# Patient Record
Sex: Female | Born: 1985 | Race: White
Health system: Northeastern US, Academic
[De-identification: ages and names within clinical notes are randomized; demographics above are authoritative.]

## PROBLEM LIST (undated history)

## (undated) DIAGNOSIS — Z8742 Personal history of other diseases of the female genital tract: Secondary | ICD-10-CM

## (undated) DIAGNOSIS — K589 Irritable bowel syndrome without diarrhea: Secondary | ICD-10-CM

## (undated) DIAGNOSIS — F419 Anxiety disorder, unspecified: Secondary | ICD-10-CM

## (undated) HISTORY — PX: OTHER SURGICAL HISTORY: SHX169

## (undated) HISTORY — DX: Personal history of other diseases of the female genital tract: Z87.42

## (undated) HISTORY — DX: Anxiety disorder, unspecified: F41.9

## (undated) HISTORY — PX: CHOLECYSTECTOMY: SHX55

## (undated) HISTORY — DX: Irritable bowel syndrome, unspecified: K58.9

---

## 2017-09-28 LAB — HM MAMMOGRAPHY

## 2019-03-25 ENCOUNTER — Ambulatory Visit
Admission: AD | Admit: 2019-03-25 | Discharge: 2019-03-25 | Disposition: A | Discharge status: Home / Self Care | Payer: PRIVATE HEALTH INSURANCE | Source: Ambulatory Visit | Attending: Emergency Medicine | Admitting: Emergency Medicine

## 2019-03-25 ENCOUNTER — Other Ambulatory Visit
Admission: RE | Admit: 2019-03-25 | Discharge: 2019-03-25 | Disposition: A | Discharge status: Home / Self Care | Payer: PRIVATE HEALTH INSURANCE | Source: Ambulatory Visit | Attending: Ophthalmology | Admitting: Ophthalmology

## 2019-03-25 DIAGNOSIS — Z20828 Contact with and (suspected) exposure to other viral communicable diseases: Secondary | ICD-10-CM | POA: Insufficient documentation

## 2019-03-25 DIAGNOSIS — Z1159 Encounter for screening for other viral diseases: Secondary | ICD-10-CM | POA: Insufficient documentation

## 2019-03-25 NOTE — ED Triage Notes (Signed)
Patient presenting to Urgent Care for testing only. COVID-19 test ordered by outside provider     Does the patient currently have symptoms concerning for COVID-19?: No     What is the reason for testing?: Pre-procedural     NP swab obtained and sent for analysis.         Triage Note   Taylormarie Register, RN

## 2019-03-26 LAB — COVID-19 NAAT (PCR): COVID-19 NAAT (PCR): 0

## 2019-10-24 LAB — BASIC METABOLIC PANEL
BUN: 15 (ref 4–21)
CO2: 22 (ref 13–22)
Chloride: 105 (ref 99–108)
Creatinine: 0.8 (ref 0.5–1.1)
Glucose: 89
Potassium: 3.9 (ref 3.4–5.3)
Sodium: 138 (ref 137–147)

## 2019-10-24 LAB — CBC AND DIFFERENTIAL
HCT: 39 (ref 36–46)
Hemoglobin: 13.2 (ref 12.0–16.0)
Platelets: 288 (ref 150–399)
WBC: 6.9

## 2019-10-24 LAB — TSH: TSH: 1.2 (ref 0.41–5.90)

## 2019-10-24 LAB — COMPREHENSIVE METABOLIC PANEL
Albumin: 4.6 (ref 3.5–5.0)
Calcium: 9.6 (ref 8.7–10.7)
Globulin: 2.5

## 2019-10-24 LAB — HEPATIC FUNCTION PANEL
ALT: 14 (ref 7–35)
AST: 18 (ref 13–35)
Alkaline Phosphatase: 26 (ref 25–125)
Bilirubin, Total: 0.3

## 2020-04-01 LAB — BASIC METABOLIC PANEL
BUN: 15 (ref 4–21)
CO2: 22 (ref 13–22)
Chloride: 106 (ref 99–108)
Creatinine: 0.8 (ref 0.5–1.1)
Glucose: 91
Potassium: 3.6 (ref 3.4–5.3)
Sodium: 139 (ref 137–147)

## 2020-04-01 LAB — CBC AND DIFFERENTIAL
HCT: 41 (ref 36–46)
Hemoglobin: 13.3 (ref 12.0–16.0)
Neutrophils Absolute: 2876
Platelets: 224 (ref 150–399)
WBC: 4.9

## 2020-04-01 LAB — TSH: TSH: 1.14 (ref 0.41–5.90)

## 2020-04-01 LAB — HEPATIC FUNCTION PANEL
ALT: 12 (ref 7–35)
AST: 14 (ref 13–35)
Alkaline Phosphatase: 19 — AB (ref 25–125)
Bilirubin, Total: 0.7

## 2020-04-01 LAB — COMPREHENSIVE METABOLIC PANEL
Albumin: 4.3 (ref 3.5–5.0)
Calcium: 9.1 (ref 8.7–10.7)
Globulin: 2.7

## 2021-01-09 DIAGNOSIS — M216X2 Other acquired deformities of left foot: Secondary | ICD-10-CM | POA: Diagnosis not present

## 2021-01-09 DIAGNOSIS — M2011 Hallux valgus (acquired), right foot: Secondary | ICD-10-CM | POA: Diagnosis not present

## 2021-01-09 DIAGNOSIS — M216X1 Other acquired deformities of right foot: Secondary | ICD-10-CM | POA: Diagnosis not present

## 2021-02-12 ENCOUNTER — Emergency Department (INDEPENDENT_AMBULATORY_CARE_PROVIDER_SITE_OTHER): Payer: BC Managed Care – PPO

## 2021-02-12 ENCOUNTER — Encounter: Payer: Self-pay | Admitting: Emergency Medicine

## 2021-02-12 ENCOUNTER — Other Ambulatory Visit: Payer: Self-pay

## 2021-02-12 ENCOUNTER — Emergency Department
Admission: EM | Admit: 2021-02-12 | Discharge: 2021-02-12 | Disposition: A | Discharge status: Home / Self Care | Payer: BC Managed Care – PPO | Source: Home / Self Care

## 2021-02-12 DIAGNOSIS — N3289 Other specified disorders of bladder: Secondary | ICD-10-CM | POA: Diagnosis not present

## 2021-02-12 DIAGNOSIS — Z9889 Other specified postprocedural states: Secondary | ICD-10-CM | POA: Diagnosis not present

## 2021-02-12 DIAGNOSIS — R1031 Right lower quadrant pain: Secondary | ICD-10-CM

## 2021-02-12 DIAGNOSIS — Z9049 Acquired absence of other specified parts of digestive tract: Secondary | ICD-10-CM | POA: Diagnosis not present

## 2021-02-12 LAB — POCT URINALYSIS DIP (MANUAL ENTRY)
Bilirubin, UA: NEGATIVE
Blood, UA: NEGATIVE
Glucose, UA: NEGATIVE mg/dL
Ketones, POC UA: NEGATIVE mg/dL
Leukocytes, UA: NEGATIVE
Nitrite, UA: NEGATIVE
Protein Ur, POC: NEGATIVE mg/dL
Spec Grav, UA: 1.025 (ref 1.010–1.025)
Urobilinogen, UA: 0.2 E.U./dL
pH, UA: 6 (ref 5.0–8.0)

## 2021-02-12 NOTE — ED Triage Notes (Signed)
Lower Rt Quadrant pain x 3 days Denies urinary sxs, BMs are regular, no new sexual partners, denies STDs, husband has vastectomy.

## 2021-02-12 NOTE — ED Provider Notes (Signed)
[facility]    CSN: [id number] Arrival date & time: 02/12/21  1421      History   Chief Complaint Chief Complaint  Patient presents with   Abdominal Pain    HPI April Paul is a 35 y.o. female.   HPI 35 year old female presents with right lower quadrant pain x3 days denies urinary symptoms, reports bowel movements are regular and denies constipation.  Denies fever.  Patient reports PMH of bilateral ovarian cyst.  Denies nausea or vomiting.  History reviewed. No pertinent past medical history.  There are no problems to display for this patient.   Past Surgical History:  Procedure Laterality Date   CESAREAN SECTION CLASSICAL     CHOLECYSTECTOMY     Ovaian cyst      OB History   No obstetric history on file.      Home Medications    Prior to Admission medications   Not on File    Family History Family History  Problem Relation Age of Onset   Rheum arthritis Mother    Healthy Father     Social History Social History   Tobacco Use   Smoking status: Never   Smokeless tobacco: Never  Vaping Use   Vaping Use: Never used     Allergies   Patient has no known allergies.   Review of Systems Review of Systems  Gastrointestinal:  Positive for abdominal pain.  All other systems reviewed and are negative.   Physical Exam Triage Vital Signs ED Triage Vitals  Enc Vitals Group     BP 02/12/21 1443 94/61     Pulse Rate 02/12/21 1443 67     Resp 02/12/21 1443 18     Temp 02/12/21 1443 98.4 F (36.9 C)     Temp Source 02/12/21 1443 Oral     SpO2 02/12/21 1443 99 %     Weight 02/12/21 1445 145 lb (65.8 kg)     Height 02/12/21 1445 5\' 4"  (1.626 m)     Head Circumference --      Peak Flow --      Pain Score 02/12/21 1444 5     Pain Loc --      Pain Edu? --      Excl. in GC? --    No data found.  Updated Vital Signs BP 94/61 (BP Location: Right Arm)   Pulse 67   Temp 98.4 F (36.9 C) (Oral)   Resp 18   Ht 5\' 4"  (1.626 m)   Wt  145 lb (65.8 kg)   LMP 01/19/2021   SpO2 99%   BMI 24.89 kg/m      Physical Exam Vitals and nursing note reviewed.  Constitutional:      General: She is not in acute distress.    Appearance: She is well-developed and normal weight. She is not ill-appearing.  HENT:     Head: Normocephalic and atraumatic.     Mouth/Throat:     Mouth: Mucous membranes are moist.     Pharynx: Oropharynx is clear.  Eyes:     Extraocular Movements: Extraocular movements intact.     Pupils: Pupils are equal, round, and reactive to light.  Cardiovascular:     Rate and Rhythm: Normal rate and regular rhythm.     Heart sounds: Normal heart sounds. No murmur heard. Pulmonary:     Effort: Pulmonary effort is normal.     Breath sounds: Normal breath sounds. No wheezing, rhonchi or rales.  Abdominal:  General: Abdomen is flat. Bowel sounds are normal.     Palpations: Abdomen is soft. There is no shifting dullness, fluid wave, hepatomegaly, splenomegaly, mass or pulsatile mass.     Tenderness: There is abdominal tenderness in the right lower quadrant. There is rebound. There is no guarding. Positive signs include McBurney's sign. Negative signs include psoas sign.     Hernia: No hernia is present.  Skin:    General: Skin is warm and dry.  Neurological:     General: No focal deficit present.     Mental Status: She is alert and oriented to person, place, and time.  Psychiatric:        Mood and Affect: Mood normal.        Behavior: Behavior normal.     UC Treatments / Results  Labs (all labs ordered are listed, but only abnormal results are displayed) Labs Reviewed  POCT URINALYSIS DIP (MANUAL ENTRY)    EKG   Radiology CT ABDOMEN PELVIS WO CONTRAST  Result Date: 02/12/2021 CLINICAL DATA:  Right lower quadrant pain for 3 days EXAM: CT ABDOMEN AND PELVIS WITHOUT CONTRAST TECHNIQUE: Multidetector CT imaging of the abdomen and pelvis was performed following the standard protocol without IV  contrast. COMPARISON:  None. FINDINGS: Lower chest: Lung bases are clear. Normal heart size. No pericardial effusion. Hepatobiliary: No visible focal liver lesions. Smooth liver surface contour. Normal hepatic attenuation. Prior cholecystectomy. No significant biliary ductal dilatation or intraductal gallstones. Pancreas: No pancreatic ductal dilatation. Question some mild edematous changes versus motion artifact with volume averaging about the pancreas. Spleen: Normal in size. No concerning splenic lesions. Adrenals/Urinary Tract: No concerning adrenal nodules or masses. Kidneys are symmetric in size and normally located. No visible or contour deforming renal lesions. No urolithiasis or hydronephrosis. Circumferential bladder wall thickening with some faint perivesicular haze. Thickening is greater than expected for underdistention. Stomach/Bowel: Distal esophagus, stomach and duodenal sweep are unremarkable. No small bowel wall thickening or dilatation. No evidence of obstruction. Normal appendix coiling about the cecal tip in the right lower quadrant. No periappendiceal inflammation. No colonic dilatation or wall thickening. Vascular/Lymphatic: No significant vascular findings are present. No enlarged abdominal or pelvic lymph nodes. Reproductive: Normal anteverted uterus. No concerning adnexal mass or lesion. Other: Small volume low-attenuation fluid in the deep pelvis is nonspecific and could be reactive or physiologic. Postsurgical changes from prior Caesarean section. No bowel containing hernia. Musculoskeletal: No acute osseous abnormality or suspicious osseous lesion. IMPRESSION: 1. Normal appendix. 2. Question some mild edematous change versus motion artifact volume after about the pancreas. Could correlate with lipase. 3. Mild bladder wall thickening is greater than expected for underdistention with some faint perivesicular hazy stranding. Recommend assessment of urinary symptoms and urinalysis were  appropriate. 4. Prior cholecystectomy, Caesarean section. Electronically Signed   By: Kreg Shropshire M.D.   On: 02/12/2021 16:00    Procedures Procedures (including critical care time)  Medications Ordered in UC Medications - No data to display  Initial Impression / Assessment and Plan / UC Course  I have reviewed the triage vital signs and the nursing notes.  Pertinent labs & imaging results that were available during my care of the patient were reviewed by me and considered in my medical decision making (see chart for details).     MDM: Right lower quadrant abdominal pain-CT abdomen/pelvis without contrast unremarkable.  Patient set-up with [facility] PCP today for establishment of care.  Patient discharged home, hemodynamically stable. Final Clinical Impressions(s) / UC Diagnoses  Final diagnoses:  Right lower quadrant abdominal pain     Discharge Instructions      Advised patient of CT of abdomen results today.  Advised patient if symptoms worsen and/or unresolved please follow-up here or with newly established PCP today for further evaluation.     ED Prescriptions   None    PDMP not reviewed this encounter.   Trevor Iha, FNP 02/12/21 1622

## 2021-02-12 NOTE — Discharge Instructions (Addendum)
Advised patient of CT of abdomen results today.  Advised patient if symptoms worsen and/or unresolved please follow-up here or with newly established PCP today for further evaluation.

## 2021-02-26 ENCOUNTER — Ambulatory Visit: Payer: BC Managed Care – PPO | Admitting: Family Medicine

## 2021-02-26 ENCOUNTER — Other Ambulatory Visit: Payer: Self-pay

## 2021-02-26 ENCOUNTER — Encounter: Payer: Self-pay | Admitting: Family Medicine

## 2021-02-26 VITALS — BP 104/59 | HR 65 | Temp 97.7°F | Ht 64.86 in | Wt 143.4 lb

## 2021-02-26 DIAGNOSIS — N939 Abnormal uterine and vaginal bleeding, unspecified: Secondary | ICD-10-CM | POA: Diagnosis not present

## 2021-02-26 DIAGNOSIS — K580 Irritable bowel syndrome with diarrhea: Secondary | ICD-10-CM

## 2021-02-26 DIAGNOSIS — K589 Irritable bowel syndrome without diarrhea: Secondary | ICD-10-CM | POA: Insufficient documentation

## 2021-02-26 NOTE — Assessment & Plan Note (Signed)
She has symptoms consistent with IBS.  She will continue to work on dietary changes as dairy products seem to trigger her symptoms.  Anxiety also seems to be a trigger.  She can consider trying IBgard as well.

## 2021-02-26 NOTE — Progress Notes (Signed)
April Paul - 35 y.o. female MRN [id number]  Date of birth: 16-Oct-1985  Subjective Chief Complaint  Patient presents with   Establish Care   Anxiety   Irritable Bowel Syndrome    HPI April Paul is a 35 year old female here today for initial visit to establish care.  She recently moved to the area from [place].  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  She has been in fairly good health.  She has had some anxiety as well as some GI issues related to this.  She has been told she has IBS in the past.  Her symptoms are mainly diarrhea and abdominal cramping.  She denies nausea.  No blood in her stool.  Symptoms seem to be worse with increased stress and anxiety and dairy products.  She has been trying to make some dietary changes to improve this.  She has had some intermittent pelvic pain with abnormal bleeding.  She has had some spotting in between periods recently.  She has had pelvic ultrasounds previously that did not show anything abnormal.  ROS:  A comprehensive ROS was completed and negative except as noted per HPI  No Known Allergies  Past Medical History:  Diagnosis Date   Anxiety    Hx of ovarian cyst    IBS (irritable bowel syndrome)     Past Surgical History:  Procedure Laterality Date   CESAREAN SECTION CLASSICAL     CHOLECYSTECTOMY     Ovaian cyst      Social History   Socioeconomic History   Marital status: Married    Spouse name: Not on file   Number of children: Not on file   Years of education: Not on file   Highest education level: Not on file  Occupational History   Not on file  Tobacco Use   Smoking status: Never   Smokeless tobacco: Never  Vaping Use   Vaping Use: Never used  Substance and Sexual Activity   Alcohol use: Yes    Alcohol/week: 3.0 - 5.0 standard drinks    Types: 3 - 5 Standard drinks or equivalent per week    Comment: Occasionally   Drug use: Never   Sexual activity: Yes     Partners: Male    Birth control/protection: Other-see comments  Other Topics Concern   Not on file  Social History Narrative   Not on file   Social Determinants of Health   Financial Resource Strain: Not on file  Food Insecurity: Not on file  Transportation Needs: Not on file  Physical Activity: Not on file  Stress: Not on file  Social Connections: Not on file    Family History  Problem Relation Age of Onset   Rheum arthritis Mother    Healthy Father    Heart attack Maternal Grandfather    Heart attack Paternal Grandmother     Health Maintenance  Topic Date Due   COVID-19 Vaccine (1) Never done   HIV Screening  Never done   Hepatitis C Screening  Never done   TETANUS/TDAP  Never done   PAP SMEAR-Modifier  Never done   INFLUENZA VACCINE  03/17/2021   Pneumococcal Vaccine 11-97 Years old  Aged Out   HPV VACCINES  Aged Out     ----------------------------------------------------------------------------------------------------------------------------------------------------------------------------------------------------------------- Physical Exam BP (!) 104/59 (BP Location: Left Arm, Patient Position: Sitting, Cuff Size: Small)   Pulse 65   Temp 97.7 F (36.5 C)   Ht 5' 4.86" (1.647 m)  Wt 143 lb 6.4 oz (65 kg)   SpO2 100%   BMI 23.96 kg/m   Physical Exam Constitutional:      Appearance: Normal appearance.  HENT:     Head: Normocephalic and atraumatic.  Eyes:     General: No scleral icterus. Cardiovascular:     Rate and Rhythm: Normal rate and regular rhythm.  Pulmonary:     Effort: Pulmonary effort is normal.     Breath sounds: Normal breath sounds.  Abdominal:     General: Abdomen is flat. There is no distension.     Palpations: Abdomen is soft.     Tenderness: There is no abdominal tenderness.  Musculoskeletal:     Cervical back: Neck supple.  Skin:    General: Skin is warm and dry.  Neurological:     General: No focal deficit present.      Mental Status: She is alert.  Psychiatric:        Mood and Affect: Mood normal.        Behavior: Behavior normal.    ------------------------------------------------------------------------------------------------------------------------------------------------------------------------------------------------------------------- Assessment and Plan  Abnormal uterine bleeding (AUB) Checking TSH and she will plan to establish with GYN.  IBS (irritable bowel syndrome) She has symptoms consistent with IBS.  She will continue to work on dietary changes as dairy products seem to trigger her symptoms.  Anxiety also seems to be a trigger.  She can consider trying IBgard as well.   No orders of the defined types were placed in this encounter.   No follow-ups on file.    This visit occurred during the SARS-CoV-2 public health emergency.  Safety protocols were in place, including screening questions prior to the visit, additional usage of staff PPE, and extensive cleaning of exam room while observing appropriate contact time as indicated for disinfecting solutions.

## 2021-02-26 NOTE — Assessment & Plan Note (Signed)
Checking TSH and she will plan to establish with GYN.

## 2021-02-26 NOTE — Patient Instructions (Signed)
Very nice to meet you today.  Try to work on dietary changes to help with GI issues.  IBGard may be helpful as well Schedule next door with GYN We'll be in touch with lab results.

## 2021-03-04 ENCOUNTER — Encounter: Payer: Self-pay | Admitting: Family Medicine

## 2021-03-04 LAB — PROTEIN, TOTAL: Total Protein: 7 (ref 6.4–8.2)

## 2021-03-26 DIAGNOSIS — N939 Abnormal uterine and vaginal bleeding, unspecified: Secondary | ICD-10-CM | POA: Diagnosis not present

## 2021-03-27 LAB — TSH+FREE T4: TSH W/REFLEX TO FT4: 1.77 mIU/L

## 2021-06-18 ENCOUNTER — Encounter: Payer: Self-pay | Admitting: Emergency Medicine

## 2021-06-18 ENCOUNTER — Other Ambulatory Visit: Payer: Self-pay

## 2021-06-18 ENCOUNTER — Emergency Department (INDEPENDENT_AMBULATORY_CARE_PROVIDER_SITE_OTHER)
Admission: EM | Admit: 2021-06-18 | Discharge: 2021-06-18 | Disposition: A | Discharge status: Home / Self Care | Payer: BC Managed Care – PPO | Source: Home / Self Care | Attending: Family Medicine | Admitting: Family Medicine

## 2021-06-18 DIAGNOSIS — J101 Influenza due to other identified influenza virus with other respiratory manifestations: Secondary | ICD-10-CM

## 2021-06-18 LAB — POCT INFLUENZA A/B
Influenza A, POC: POSITIVE — AB
Influenza B, POC: NEGATIVE

## 2021-06-18 MED ORDER — OSELTAMIVIR PHOSPHATE 75 MG PO CAPS: 75.0000 mg | ORAL_CAPSULE | Freq: Two times a day (BID) | ORAL | 0 refills | Status: DC

## 2021-06-18 MED ORDER — ACETAMINOPHEN 325 MG PO TABS
650.0000 mg | ORAL_TABLET | Freq: Once | ORAL | Status: AC
  Administered 2021-06-18: 650 mg via ORAL

## 2021-06-18 NOTE — Discharge Instructions (Addendum)
Get plenty of rest, push fluids May take Tylenol, Advil or Aleve for pain and fever Take the Tamiflu 2 times a day for 5 days Call for problems

## 2021-06-18 NOTE — ED Provider Notes (Signed)
[facility]    CSN: [id number] Arrival date & time: 06/18/21  1244      History   Chief Complaint Chief Complaint  Patient presents with   Cough    HPI April Paul is a 35 y.o. female.   HPI Patient is here with 101 and half fever.  She had sudden onset of body aches headache cough last night.  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  She did do a COVID test which was negative. Past Medical History:  Diagnosis Date   Anxiety    Hx of ovarian cyst    IBS (irritable bowel syndrome)     Patient Active Problem List   Diagnosis Date Noted   Abnormal uterine bleeding (AUB) 02/26/2021   IBS (irritable bowel syndrome) 02/26/2021    Past Surgical History:  Procedure Laterality Date   CESAREAN SECTION CLASSICAL     CHOLECYSTECTOMY     Ovaian cyst      OB History   No obstetric history on file.      Home Medications    Prior to Admission medications   Medication Sig Start Date End Date Taking? Authorizing Provider  oseltamivir (TAMIFLU) 75 MG capsule Take 1 capsule (75 mg total) by mouth every 12 (twelve) hours. 06/18/21  Yes Eustace Moore, MD    Family History Family History  Problem Relation Age of Onset   Rheum arthritis Mother    Healthy Father    Heart attack Maternal Grandfather    Heart attack Paternal Grandmother     Social History Social History   Tobacco Use   Smoking status: Never   Smokeless tobacco: Never  Vaping Use   Vaping Use: Never used  Substance Use Topics   Alcohol use: Yes    Alcohol/week: 3.0 - 5.0 standard drinks    Types: 3 - 5 Standard drinks or equivalent per week    Comment: Occasionally   Drug use: Never     Allergies   Patient has no known allergies.   Review of Systems Review of Systems See HPI  Physical Exam Triage Vital Signs ED Triage Vitals  Enc Vitals Group     BP 06/18/21 1310 106/74     Pulse Rate 06/18/21 1310 97     Resp 06/18/21 1310 18     Temp  06/18/21 1310 (!) 101.5 F (38.6 C)     Temp Source 06/18/21 1310 Oral     SpO2 06/18/21 1310 97 %     Weight 06/18/21 1311 142 lb (64.4 kg)     Height --      Head Circumference --      Peak Flow --      Pain Score 06/18/21 1310 4     Pain Loc --      Pain Edu? --      Excl. in GC? --    No data found.  Updated Vital Signs BP 106/74 (BP Location: Right Arm)   Pulse 97   Temp (!) 101.5 F (38.6 C) (Oral)   Resp 18   Wt 64.4 kg   LMP 06/18/2021 (Exact Date)   SpO2 97%   BMI 23.73 kg/m      Physical Exam Constitutional:      General: She is not in acute distress.    Appearance: She is well-developed and normal weight. She is ill-appearing.  HENT:     Head: Normocephalic and atraumatic.  Mouth/Throat:     Comments: Mask is in place Eyes:     Conjunctiva/sclera: Conjunctivae normal.     Pupils: Pupils are equal, round, and reactive to light.  Cardiovascular:     Rate and Rhythm: Normal rate and regular rhythm.  Pulmonary:     Effort: Pulmonary effort is normal. No respiratory distress.     Breath sounds: Rhonchi present. No wheezing.     Comments: Few scattered rhonchi Abdominal:     General: There is no distension.     Palpations: Abdomen is soft.  Musculoskeletal:        General: Normal range of motion.     Cervical back: Normal range of motion.  Skin:    General: Skin is warm and dry.  Neurological:     Mental Status: She is alert.     UC Treatments / Results  Labs (all labs ordered are listed, but only abnormal results are displayed) Labs Reviewed  POCT INFLUENZA A/B - Abnormal; Notable for the following components:      Result Value   Influenza A, POC Positive (*)    All other components within normal limits    EKG   Radiology No results found.  Procedures Procedures (including critical care time)  Medications Ordered in UC Medications  acetaminophen (TYLENOL) tablet 650 mg (650 mg Oral Given 06/18/21 1322)    Initial Impression /  Assessment and Plan / UC Course  I have reviewed the triage vital signs and the nursing notes.  Pertinent labs & imaging results that were available during my care of the patient were reviewed by me and considered in my medical decision making (see chart for details).     Tamiflu prescribed Final Clinical Impressions(s) / UC Diagnoses   Final diagnoses:  Influenza A     Discharge Instructions      Get plenty of rest, push fluids May take Tylenol, Advil or Aleve for pain and fever Take the Tamiflu 2 times a day for 5 days Call for problems     ED Prescriptions     Medication Sig Dispense Auth. Provider   oseltamivir (TAMIFLU) 75 MG capsule Take 1 capsule (75 mg total) by mouth every 12 (twelve) hours. 10 capsule Eustace Moore, MD      PDMP not reviewed this encounter.   Eustace Moore, MD 06/18/21 2041

## 2021-06-18 NOTE — ED Triage Notes (Signed)
Pt states she had sudden onset of body ahces, HA, cough last night. Negative covid test at work this am. Tylenol 6 am

## 2021-10-14 ENCOUNTER — Encounter: Payer: Self-pay | Admitting: Family Medicine

## 2021-10-15 ENCOUNTER — Telehealth: Payer: BC Managed Care – PPO | Admitting: Physician Assistant

## 2021-10-15 DIAGNOSIS — H109 Unspecified conjunctivitis: Secondary | ICD-10-CM | POA: Diagnosis not present

## 2021-10-15 MED ORDER — POLYMYXIN B-TRIMETHOPRIM 10000-0.1 UNIT/ML-% OP SOLN: 1.0000 [drp] | OPHTHALMIC | 0 refills | Status: DC

## 2021-10-15 NOTE — Progress Notes (Signed)

## 2021-10-15 NOTE — Telephone Encounter (Signed)
EDIT: Mailbox was full and could not LVM after all.  ?

## 2021-10-15 NOTE — Telephone Encounter (Signed)
LVM for patient to call back to schedule an appt.

## 2021-11-30 IMAGING — CT CT ABD-PELV W/O CM
2 of 4 series · 15 of 46 positions shown, 17 images · non-contrast
Comparison: None.

CLINICAL DATA: Right lower quadrant pain for 3 days

EXAM:
CT ABDOMEN AND PELVIS WITHOUT CONTRAST
TECHNIQUE: Multidetector CT imaging of the abdomen and pelvis was performed
following the standard protocol without IV contrast.

[Series 2: axial st · axial · 0.65mm/px · z∈[-446,-46]mm · 12 of 92 slices shown, 14 images]
[im 8/92  soft-tissue]
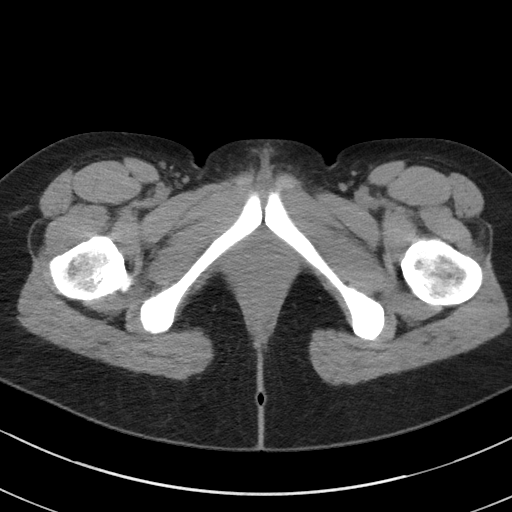
[im 8/92  bone]
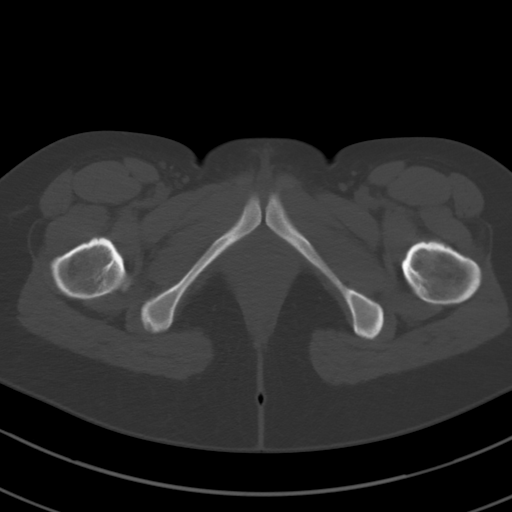
[im 15/92  soft-tissue]
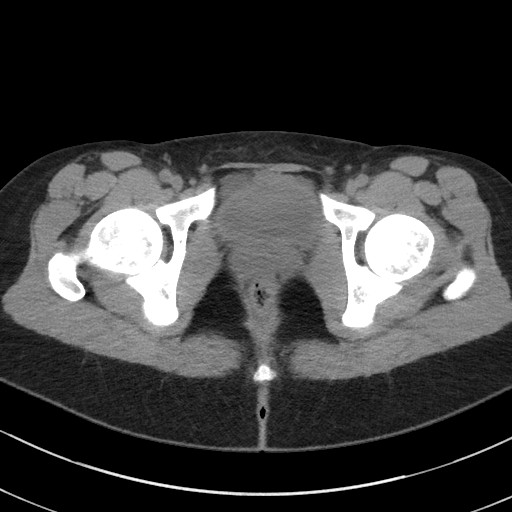
[im 22/92  soft-tissue]
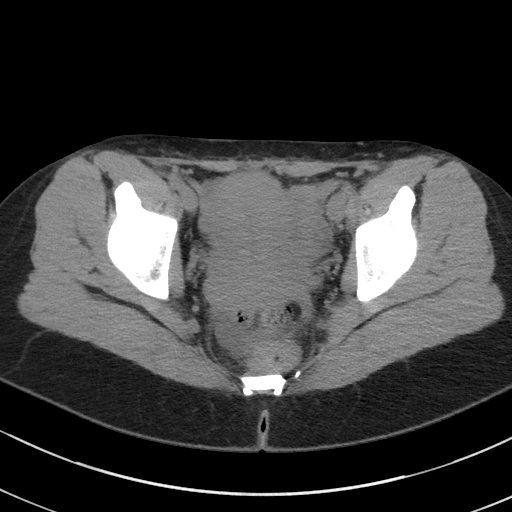
[im 30/92  soft-tissue]
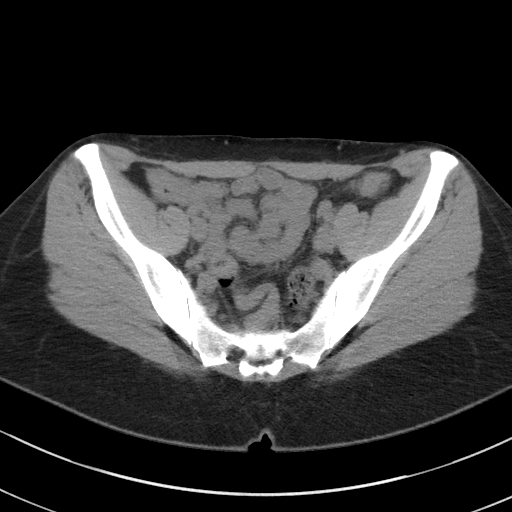
[im 37/92  soft-tissue]
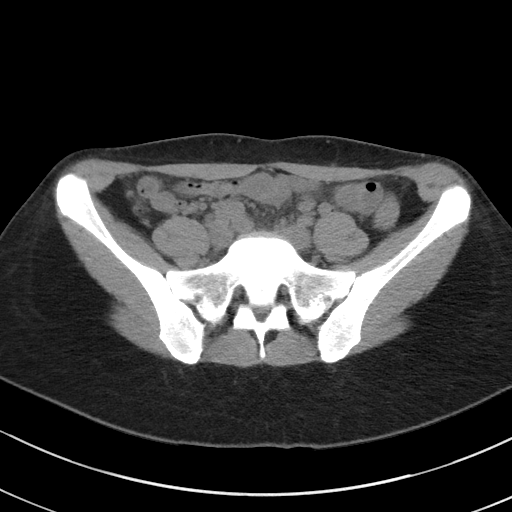
[im 44/92  soft-tissue]
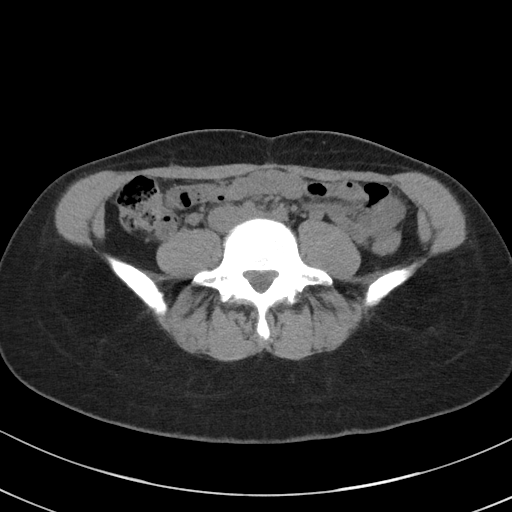
[im 51/92  soft-tissue]
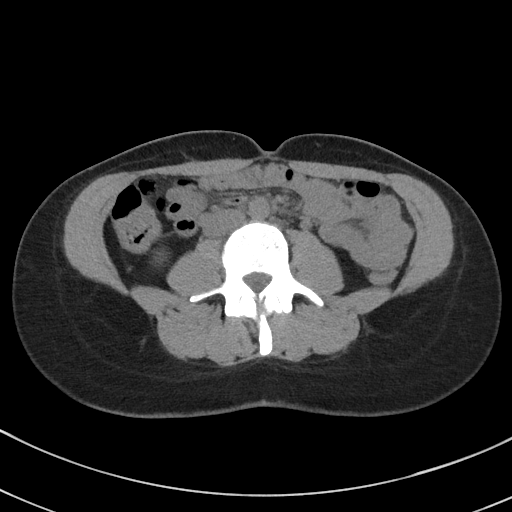
[im 59/92  soft-tissue]
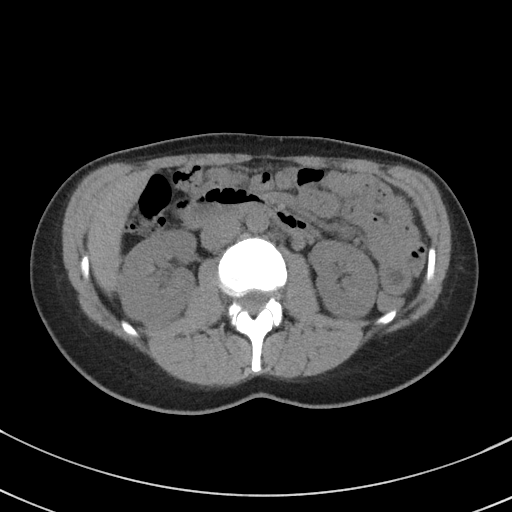
[im 66/92  soft-tissue]
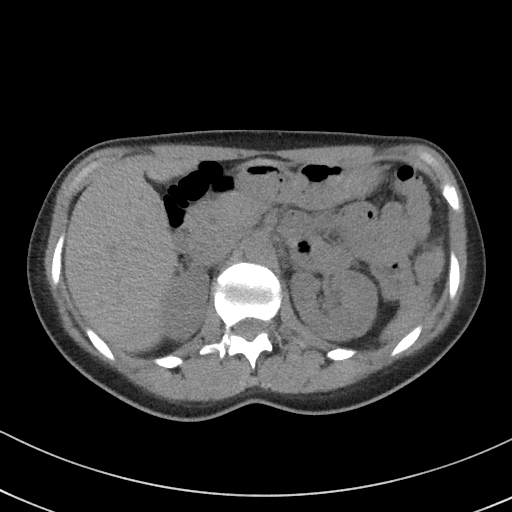
[im 66/92  bone]
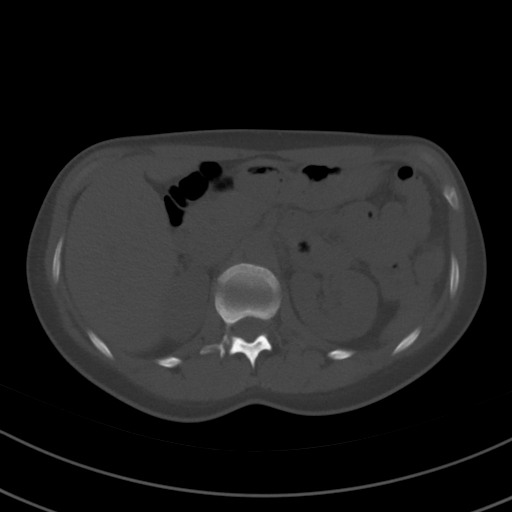
[im 73/92  soft-tissue]
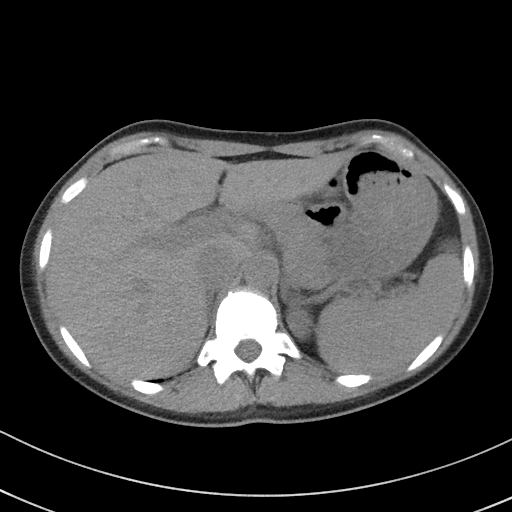
[im 81/92  soft-tissue]
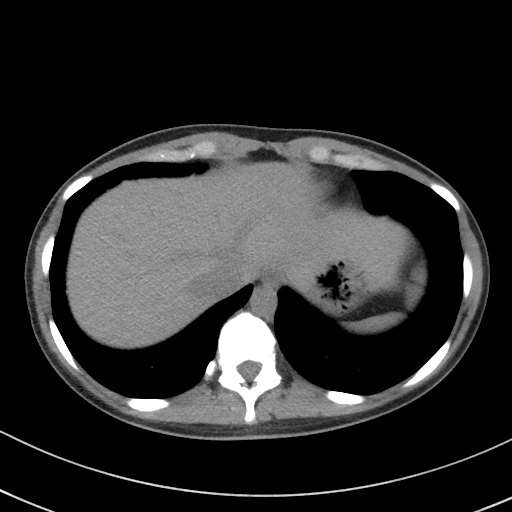
[im 88/92  soft-tissue]
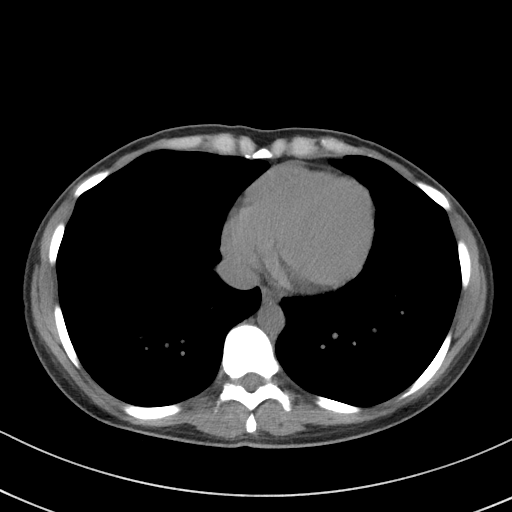

[Series 5: coronal st · coronal · 0.62mm/px · 3 of 68 slices shown]
[im 23/68  soft-tissue]
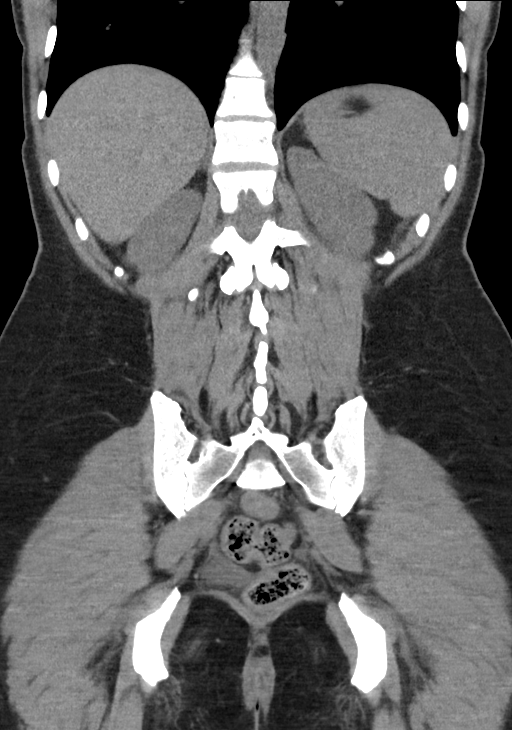
[im 30/68  soft-tissue]
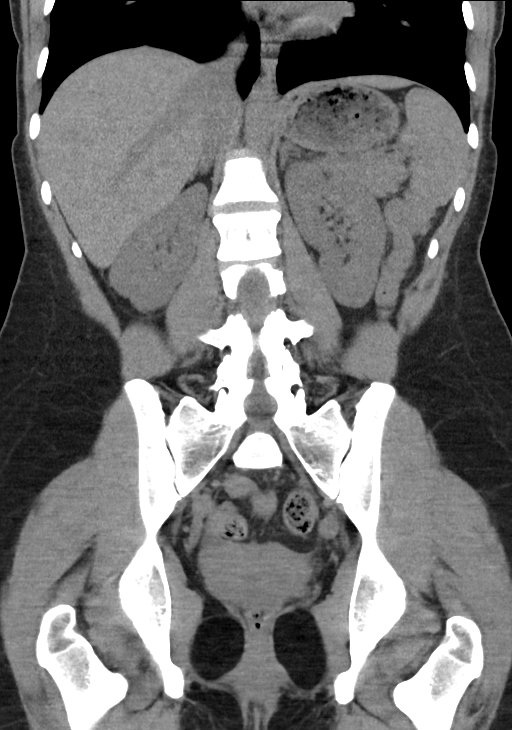
[im 38/68  soft-tissue]
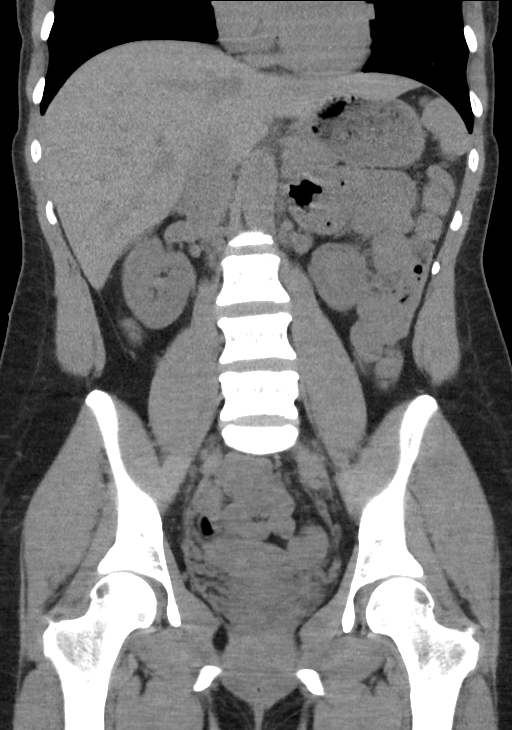

[15 of 46 positions shown; findings below may reference images not displayed]

FINDINGS: Lower chest: Lung bases are clear. Normal heart size. No pericardial
effusion.

Hepatobiliary: No visible focal liver lesions. Smooth liver surface
contour. Normal hepatic attenuation. Prior cholecystectomy. No
significant biliary ductal dilatation or intraductal gallstones.

Pancreas: No pancreatic ductal dilatation. Question some mild
edematous changes versus motion artifact with volume averaging about
the pancreas.

Spleen: Normal in size. No concerning splenic lesions.

Adrenals/Urinary Tract: No concerning adrenal nodules or masses.
Kidneys are symmetric in size and normally located. No visible or
contour deforming renal lesions. No urolithiasis or hydronephrosis.
Circumferential bladder wall thickening with some faint
perivesicular haze. Thickening is greater than expected for
underdistention.

Stomach/Bowel: Distal esophagus, stomach and duodenal sweep are
unremarkable. No small bowel wall thickening or dilatation. No
evidence of obstruction. Normal appendix coiling about the cecal tip
in the right lower quadrant. No periappendiceal inflammation. No
colonic dilatation or wall thickening.

Vascular/Lymphatic: No significant vascular findings are present. No
enlarged abdominal or pelvic lymph nodes.

Reproductive: Normal anteverted uterus. No concerning adnexal mass
or lesion.

Other: Small volume low-attenuation fluid in the deep pelvis is
nonspecific and could be reactive or physiologic. Postsurgical
changes from prior Caesarean section. No bowel containing hernia.

Musculoskeletal: No acute osseous abnormality or suspicious osseous
lesion.
IMPRESSION: 1. Normal appendix.
2. Question some mild edematous change versus motion artifact volume
after about the pancreas. Could correlate with lipase.
3. Mild bladder wall thickening is greater than expected for
underdistention with some faint perivesicular hazy stranding.
Recommend assessment of urinary symptoms and urinalysis were
appropriate.
4. Prior cholecystectomy, Caesarean section.

## 2021-12-20 ENCOUNTER — Encounter: Payer: Self-pay | Admitting: Family Medicine

## 2021-12-23 ENCOUNTER — Ambulatory Visit: Payer: BC Managed Care – PPO | Admitting: Family Medicine

## 2022-01-05 ENCOUNTER — Encounter: Payer: Self-pay | Admitting: Family Medicine

## 2022-01-05 ENCOUNTER — Ambulatory Visit: Payer: BC Managed Care – PPO | Admitting: Family Medicine

## 2022-01-05 VITALS — BP 95/61 | HR 87 | Ht 64.86 in | Wt 146.0 lb

## 2022-01-05 DIAGNOSIS — D649 Anemia, unspecified: Secondary | ICD-10-CM | POA: Diagnosis not present

## 2022-01-05 DIAGNOSIS — K529 Noninfective gastroenteritis and colitis, unspecified: Secondary | ICD-10-CM | POA: Diagnosis not present

## 2022-01-05 DIAGNOSIS — K625 Hemorrhage of anus and rectum: Secondary | ICD-10-CM | POA: Diagnosis not present

## 2022-01-05 NOTE — Assessment & Plan Note (Signed)
Rectal bleeding has resolved at this point so rectal exam and anoscopy were deferred at this time.  She may have had irritation of internal hemorrhoid from her chronic diarrhea.  I do think she needs further evaluation for possible inflammatory bowel disease.  Checking inflammatory markers as well as fecal calprotectin.  She does not want a colonoscopy at this time.

## 2022-01-05 NOTE — Progress Notes (Signed)
April Paul - 36 y.o. female MRN [id number]  Date of birth: [birth year]  Subjective Chief Complaint  Patient presents with   Blood In Stools    HPI April Paul is a 36 y.o. female here today with complaint of blood in her stool.  She first noticed this a couple weeks ago.  She has not had any pain with bowel movements.  Blood was mixed in her stool as well as when wiping.  She has had problems with hemorrhoids in the past but this seems different as she had some pain and irritation with hemorrhoids previously.  She does have history of IBS and has frequent loose stools.  She has never had any further evaluation testing for inflammatory bowel disease or colonoscopy.  She does endorse having some stomach cramping fairly often.  ROS:  A comprehensive ROS was completed and negative except as noted per HPI  No Known Allergies  Past Medical History:  Diagnosis Date   Anxiety    Hx of ovarian cyst    IBS (irritable bowel syndrome)     Past Surgical History:  Procedure Laterality Date   CESAREAN SECTION CLASSICAL     CHOLECYSTECTOMY     Ovaian cyst      Social History   Socioeconomic History   Marital status: Married    Spouse name: Not on file   Number of children: Not on file   Years of education: Not on file   Highest education level: Not on file  Occupational History   Not on file  Tobacco Use   Smoking status: Never   Smokeless tobacco: Never  Vaping Use   Vaping Use: Never used  Substance and Sexual Activity   Alcohol use: Yes    Alcohol/week: 3.0 - 5.0 standard drinks    Types: 3 - 5 Standard drinks or equivalent per week    Comment: Occasionally   Drug use: Never   Sexual activity: Yes    Partners: Male    Birth control/protection: Other-see comments  Other Topics Concern   Not on file  Social History Narrative   Not on file   Social Determinants of Health   Financial Resource Strain: Not on file  Food Insecurity: Not on file  Transportation Needs:  Not on file  Physical Activity: Not on file  Stress: Not on file  Social Connections: Not on file    Family History  Problem Relation Age of Onset   Rheum arthritis Mother    Healthy Father    Heart attack Maternal Grandfather    Heart attack Paternal Grandmother     Health Maintenance  Topic Date Due   PAP SMEAR-Modifier  01/05/2022 (Originally 09/01/2008)   COVID-19 Vaccine (1) 04/17/2022 (Originally 03/01/1986)   Hepatitis C Screening  01/06/2023 (Originally 09/01/2005)   HIV Screening  01/06/2023 (Originally 09/01/2002)   INFLUENZA VACCINE  03/17/2022   TETANUS/TDAP  05/28/2026   HPV VACCINES  Aged Out     ----------------------------------------------------------------------------------------------------------------------------------------------------------------------------------------------------------------- Physical Exam BP 95/61 (BP Location: Left Arm, Patient Position: Sitting, Cuff Size: Small)   Pulse 87   Ht 5' 4.86" (1.647 m)   Wt 146 lb (66.2 kg)   SpO2 100%   BMI 24.40 kg/m   Physical Exam Constitutional:      Appearance: Normal appearance.  Cardiovascular:     Rate and Rhythm: Normal rate and regular rhythm.  Pulmonary:     Effort: Pulmonary effort is normal.     Breath sounds: Normal breath sounds.  Abdominal:  General: Abdomen is flat. There is no distension.     Palpations: Abdomen is soft.  Musculoskeletal:     Cervical back: Neck supple.  Neurological:     Mental Status: She is alert.  Psychiatric:        Mood and Affect: Mood normal.        Behavior: Behavior normal.    ------------------------------------------------------------------------------------------------------------------------------------------------------------------------------------------------------------------- Assessment and Plan  Rectal bleeding Rectal bleeding has resolved at this point so rectal exam and anoscopy were deferred at this time.  She may have had  irritation of internal hemorrhoid from her chronic diarrhea.  I do think she needs further evaluation for possible inflammatory bowel disease.  Checking inflammatory markers as well as fecal calprotectin.  She does not want a colonoscopy at this time.  Chronic diarrhea Told the past she had IBS however has not had any formal evaluation or referral to GI.  Labs were ordered today.  Checking C. difficile, inflammatory markers and celiac panel.   No orders of the defined types were placed in this encounter.   No follow-ups on file.    This visit occurred during the SARS-CoV-2 public health emergency.  Safety protocols were in place, including screening questions prior to the visit, additional usage of staff PPE, and extensive cleaning of exam room while observing appropriate contact time as indicated for disinfecting solutions.

## 2022-01-05 NOTE — Assessment & Plan Note (Signed)
Told the past she had IBS however has not had any formal evaluation or referral to GI.  Labs were ordered today.  Checking C. difficile, inflammatory markers and celiac panel.

## 2022-01-06 LAB — CBC WITH DIFFERENTIAL/PLATELET
Absolute Monocytes: 281 cells/uL (ref 200–950)
Basophils Absolute: 43 cells/uL (ref 0–200)
Basophils Relative: 0.7 %
Eosinophils Absolute: 43 cells/uL (ref 15–500)
Eosinophils Relative: 0.7 %
HCT: 37 % (ref 35.0–45.0)
Hemoglobin: 12.6 g/dL (ref 11.7–15.5)
Lymphs Abs: 1800 cells/uL (ref 850–3900)
MCH: 30.8 pg (ref 27.0–33.0)
MCHC: 34.1 g/dL (ref 32.0–36.0)
MCV: 90.5 fL (ref 80.0–100.0)
MPV: 9.8 fL (ref 7.5–12.5)
Monocytes Relative: 4.6 %
Neutro Abs: 3935 cells/uL (ref 1500–7800)
Neutrophils Relative %: 64.5 %
Platelets: 234 10*3/uL (ref 140–400)
RBC: 4.09 10*6/uL (ref 3.80–5.10)
RDW: 12.1 % (ref 11.0–15.0)
Total Lymphocyte: 29.5 %
WBC: 6.1 10*3/uL (ref 3.8–10.8)

## 2022-01-06 LAB — IRON,TIBC AND FERRITIN PANEL
%SAT: 14 % (calc) — ABNORMAL LOW (ref 16–45)
Ferritin: 22 ng/mL (ref 16–154)
Iron: 51 ug/dL (ref 40–190)
TIBC: 374 mcg/dL (calc) (ref 250–450)

## 2022-01-06 LAB — C-REACTIVE PROTEIN: CRP: 0.5 mg/L (ref <8.0)

## 2022-01-06 LAB — CELIAC DISEASE PANEL
(tTG) Ab, IgA: <1 U/mL
(tTG) Ab, IgG: <1 U/mL
Gliadin IgA: <1 U/mL
Gliadin IgG: <1 U/mL
Immunoglobulin A: 138 mg/dL (ref 47–310)

## 2022-01-06 LAB — SEDIMENTATION RATE: Sed Rate: 2 mm/h (ref 0–20)

## 2022-09-24 ENCOUNTER — Encounter: Payer: Self-pay | Admitting: Family Medicine

## 2022-09-24 ENCOUNTER — Ambulatory Visit (INDEPENDENT_AMBULATORY_CARE_PROVIDER_SITE_OTHER): Payer: BC Managed Care – PPO | Admitting: Family Medicine

## 2022-09-24 VITALS — BP 96/66 | HR 67 | Ht 60.0 in | Wt 143.8 lb

## 2022-09-24 DIAGNOSIS — Z13220 Encounter for screening for lipoid disorders: Secondary | ICD-10-CM | POA: Diagnosis not present

## 2022-09-24 DIAGNOSIS — Z0000 Encounter for general adult medical examination without abnormal findings: Secondary | ICD-10-CM | POA: Diagnosis not present

## 2022-09-24 DIAGNOSIS — R4589 Other symptoms and signs involving emotional state: Secondary | ICD-10-CM

## 2022-09-24 DIAGNOSIS — R5383 Other fatigue: Secondary | ICD-10-CM

## 2022-09-24 DIAGNOSIS — K529 Noninfective gastroenteritis and colitis, unspecified: Secondary | ICD-10-CM

## 2022-09-24 DIAGNOSIS — K921 Melena: Secondary | ICD-10-CM

## 2022-09-24 DIAGNOSIS — E611 Iron deficiency: Secondary | ICD-10-CM

## 2022-09-24 NOTE — Assessment & Plan Note (Signed)
Continues to have recurrent diarrhea.  Has had blood in her stool.  Referral placed to gastroenterology.

## 2022-09-24 NOTE — Patient Instructions (Signed)

## 2022-09-24 NOTE — Progress Notes (Signed)
April Paul - 37 y.o. female MRN [id number]  Date of birth: 06-04-1986  Subjective Chief Complaint  Patient presents with   Annual Exam    Few concerns     HPI April Paul is a 37 year old female here today for annual exam.  She reports feeling a little more down and depressed over the past few months.  Does have some mild anxiety that accompanies this.  She has had anxiety in the past but never really felt depressed.  She has never been on medication for either of these.  She would like to have labs to check for any organic causes that may be contributing to her mood changes as well as decreased energy levels.  She has been trying to get an appointment with a therapist however has had difficulty finding something in a timely manner.  She is moderately active..  Her diet is pretty good most of the time.  She is a non-smoker.  Occasional alcohol use.  She sees her GYN regularly.  She has regular dental care.  Review of Systems  Constitutional:  Negative for chills, fever, malaise/fatigue and weight loss.  HENT:  Negative for congestion, ear pain and sore throat.   Eyes:  Negative for blurred vision, double vision and pain.  Respiratory:  Negative for cough and shortness of breath.   Cardiovascular:  Negative for chest pain and palpitations.  Gastrointestinal:  Negative for abdominal pain, blood in stool, constipation, heartburn and nausea.  Genitourinary:  Negative for dysuria and urgency.  Musculoskeletal:  Negative for joint pain and myalgias.  Neurological:  Negative for dizziness and headaches.  Endo/Heme/Allergies:  Does not bruise/bleed easily.  Psychiatric/Behavioral:  Negative for depression. The patient is not nervous/anxious and does not have insomnia.     No Known Allergies  Past Medical History:  Diagnosis Date   Anxiety    Hx of ovarian cyst    IBS (irritable bowel syndrome)     Past Surgical History:  Procedure Laterality Date   CESAREAN SECTION  CLASSICAL     CHOLECYSTECTOMY     Ovaian cyst      Social History   Socioeconomic History   Marital status: Married    Spouse name: Not on file   Number of children: Not on file   Years of education: Not on file   Highest education level: Not on file  Occupational History   Not on file  Tobacco Use   Smoking status: Never   Smokeless tobacco: Never  Vaping Use   Vaping Use: Never used  Substance and Sexual Activity   Alcohol use: Yes    Alcohol/week: 3.0 - 5.0 standard drinks of alcohol    Types: 3 - 5 Standard drinks or equivalent per week    Comment: Occasionally   Drug use: Never   Sexual activity: Yes    Partners: Male    Birth control/protection: Other-see comments  Other Topics Concern   Not on file  Social History Narrative   Not on file   Social Determinants of Health   Financial Resource Strain: Not on file  Food Insecurity: Not on file  Transportation Needs: Not on file  Physical Activity: Not on file  Stress: Not on file  Social Connections: Not on file    Family History  Problem Relation Age of Onset   Rheum arthritis Mother    Healthy Father    Heart attack Maternal Grandfather    Heart attack Paternal Grandmother     Health Maintenance  Topic Date Due   INFLUENZA VACCINE  11/15/2022 (Originally 03/17/2022)   PAP SMEAR-Modifier  12/14/2022 (Originally 09/01/2008)   Hepatitis C Screening  01/06/2023 (Originally 09/01/2005)   HIV Screening  01/06/2023 (Originally 09/01/2002)   COVID-19 Vaccine (1) 10/11/2023 (Originally 03/01/1986)   DTaP/Tdap/Td (2 - Td or Tdap) 05/28/2026   HPV VACCINES  Aged Out     ----------------------------------------------------------------------------------------------------------------------------------------------------------------------------------------------------------------- Physical Exam BP 96/66 (BP Location: Left Arm, Patient Position: Sitting, Cuff Size: Normal)   Pulse 67   Ht 5' (1.524 m)   Wt 143 lb  12.8 oz (65.2 kg)   SpO2 100%   BMI 28.08 kg/m   Physical Exam Constitutional:      General: She is not in acute distress. HENT:     Head: Normocephalic and atraumatic.     Right Ear: Tympanic membrane and ear canal normal.     Left Ear: Tympanic membrane and ear canal normal.     Nose: Nose normal.  Eyes:     General: No scleral icterus.    Conjunctiva/sclera: Conjunctivae normal.  Neck:     Thyroid: No thyromegaly.  Cardiovascular:     Rate and Rhythm: Normal rate and regular rhythm.     Heart sounds: Normal heart sounds.  Pulmonary:     Effort: Pulmonary effort is normal.     Breath sounds: Normal breath sounds.  Abdominal:     General: Bowel sounds are normal. There is no distension.     Palpations: Abdomen is soft.     Tenderness: There is no abdominal tenderness. There is no guarding.  Musculoskeletal:        General: Normal range of motion.     Cervical back: Normal range of motion and neck supple.  Lymphadenopathy:     Cervical: No cervical adenopathy.  Skin:    General: Skin is warm and dry.     Findings: No rash.  Neurological:     General: No focal deficit present.     Mental Status: She is alert and oriented to person, place, and time.     Cranial Nerves: No cranial nerve deficit.     Coordination: Coordination normal.  Psychiatric:        Mood and Affect: Mood normal.        Behavior: Behavior normal.     ------------------------------------------------------------------------------------------------------------------------------------------------------------------------------------------------------------------- Assessment and Plan  Chronic diarrhea Continues to have recurrent diarrhea.  Has had blood in her stool.  Referral placed to gastroenterology.  Well adult exam Well adult Orders Placed This Encounter  Procedures   COMPLETE METABOLIC PANEL WITH GFR   CBC with Differential   Lipid Panel w/reflex Direct LDL   TSH   Vitamin D (25 hydroxy)    B12   Iron, TIBC and Ferritin Panel   Ambulatory referral to Psychology    Referral Priority:   Routine    Referral Type:   Psychiatric    Referral Reason:   Specialty Services Required    Requested Specialty:   Psychology    Number of Visits Requested:   1   Ambulatory referral to Gastroenterology    Referral Priority:   Routine    Referral Type:   Consultation    Referral Reason:   Specialty Services Required    Number of Visits Requested:   1  Screenings: Per lab orders Immunizations: Up-to-date Anticipatory guidance/risk factor reduction: Recommendations per AVS  Depressed mood Checking for additional organic causes including hypothyroidism.  Referral placed for therapy.   No orders of the defined  types were placed in this encounter.   No follow-ups on file.    This visit occurred during the SARS-CoV-2 public health emergency.  Safety protocols were in place, including screening questions prior to the visit, additional usage of staff PPE, and extensive cleaning of exam room while observing appropriate contact time as indicated for disinfecting solutions.

## 2022-09-24 NOTE — Assessment & Plan Note (Signed)
Checking for additional organic causes including hypothyroidism.  Referral placed for therapy.

## 2022-09-24 NOTE — Assessment & Plan Note (Signed)
Well adult Orders Placed This Encounter  Procedures   COMPLETE METABOLIC PANEL WITH GFR   CBC with Differential   Lipid Panel w/reflex Direct LDL   TSH   Vitamin D (25 hydroxy)   B12   Iron, TIBC and Ferritin Panel   Ambulatory referral to Psychology    Referral Priority:   Routine    Referral Type:   Psychiatric    Referral Reason:   Specialty Services Required    Requested Specialty:   Psychology    Number of Visits Requested:   1   Ambulatory referral to Gastroenterology    Referral Priority:   Routine    Referral Type:   Consultation    Referral Reason:   Specialty Services Required    Number of Visits Requested:   1  Screenings: Per lab orders Immunizations: Up-to-date Anticipatory guidance/risk factor reduction: Recommendations per AVS

## 2022-09-29 ENCOUNTER — Ambulatory Visit: Payer: BC Managed Care – PPO | Admitting: Family Medicine

## 2022-10-01 DIAGNOSIS — R6889 Other general symptoms and signs: Secondary | ICD-10-CM | POA: Diagnosis not present

## 2022-10-01 DIAGNOSIS — E559 Vitamin D deficiency, unspecified: Secondary | ICD-10-CM | POA: Diagnosis not present

## 2022-10-01 DIAGNOSIS — E611 Iron deficiency: Secondary | ICD-10-CM | POA: Diagnosis not present

## 2022-10-01 DIAGNOSIS — R5383 Other fatigue: Secondary | ICD-10-CM | POA: Diagnosis not present

## 2022-10-01 DIAGNOSIS — Z13220 Encounter for screening for lipoid disorders: Secondary | ICD-10-CM | POA: Diagnosis not present

## 2022-10-02 ENCOUNTER — Encounter: Payer: Self-pay | Admitting: Family Medicine

## 2022-10-02 LAB — CBC WITH DIFFERENTIAL/PLATELET
Absolute Monocytes: 213 cells/uL (ref 200–950)
Basophils Absolute: 29 cells/uL (ref 0–200)
Basophils Relative: 0.7 %
Eosinophils Absolute: 70 cells/uL (ref 15–500)
Eosinophils Relative: 1.7 %
HCT: 40.2 % (ref 35.0–45.0)
Hemoglobin: 13.6 g/dL (ref 11.7–15.5)
Lymphs Abs: 1320 cells/uL (ref 850–3900)
MCH: 30.5 pg (ref 27.0–33.0)
MCHC: 33.8 g/dL (ref 32.0–36.0)
MCV: 90.1 fL (ref 80.0–100.0)
MPV: 10.6 fL (ref 7.5–12.5)
Monocytes Relative: 5.2 %
Neutro Abs: 2468 cells/uL (ref 1500–7800)
Neutrophils Relative %: 60.2 %
Platelets: 204 10*3/uL (ref 140–400)
RBC: 4.46 10*6/uL (ref 3.80–5.10)
RDW: 11.9 % (ref 11.0–15.0)
Total Lymphocyte: 32.2 %
WBC: 4.1 10*3/uL (ref 3.8–10.8)

## 2022-10-02 LAB — LIPID PANEL W/REFLEX DIRECT LDL
Cholesterol: 165 mg/dL (ref <200)
HDL: 81 mg/dL (ref >=50)
LDL Cholesterol (Calc): 70 mg/dL (calc)
Non-HDL Cholesterol (Calc): 84 mg/dL (calc) (ref <130)
Total CHOL/HDL Ratio: 2 (calc) (ref <5.0)
Triglycerides: 60 mg/dL (ref <150)

## 2022-10-02 LAB — COMPLETE METABOLIC PANEL WITH GFR
AG Ratio: 1.6 (calc) (ref 1.0–2.5)
ALT: 12 U/L (ref 6–29)
AST: 13 U/L (ref 10–30)
Albumin: 4.3 g/dL (ref 3.6–5.1)
Alkaline phosphatase (APISO): 17 U/L — ABNORMAL LOW (ref 31–125)
BUN: 20 mg/dL (ref 7–25)
CO2: 23 mmol/L (ref 20–32)
Calcium: 9.4 mg/dL (ref 8.6–10.2)
Chloride: 107 mmol/L (ref 98–110)
Creat: 0.71 mg/dL (ref 0.50–0.97)
Globulin: 2.7 g/dL (calc) (ref 1.9–3.7)
Glucose, Bld: 87 mg/dL (ref 65–99)
Potassium: 4.3 mmol/L (ref 3.5–5.3)
Sodium: 140 mmol/L (ref 135–146)
Total Bilirubin: 0.4 mg/dL (ref 0.2–1.2)
Total Protein: 7 g/dL (ref 6.1–8.1)
eGFR: 114 mL/min/{1.73_m2} (ref >=60)

## 2022-10-02 LAB — IRON,TIBC AND FERRITIN PANEL
%SAT: 23 % (calc) (ref 16–45)
Ferritin: 31 ng/mL (ref 16–154)
Iron: 80 ug/dL (ref 40–190)
TIBC: 354 mcg/dL (calc) (ref 250–450)

## 2022-10-02 LAB — VITAMIN B12: Vitamin B-12: 439 pg/mL (ref 200–1100)

## 2022-10-02 LAB — VITAMIN D 25 HYDROXY (VIT D DEFICIENCY, FRACTURES): Vit D, 25-Hydroxy: 25 ng/mL — ABNORMAL LOW (ref 30–100)

## 2022-10-02 LAB — TSH: TSH: 1.24 mIU/L

## 2022-10-06 MED ORDER — FLUOXETINE HCL 20 MG PO CAPS: 20.0000 mg | ORAL_CAPSULE | Freq: Every day | ORAL | 0 refills | Status: DC

## 2022-10-16 DIAGNOSIS — D509 Iron deficiency anemia, unspecified: Secondary | ICD-10-CM | POA: Diagnosis not present

## 2022-10-16 DIAGNOSIS — R197 Diarrhea, unspecified: Secondary | ICD-10-CM | POA: Diagnosis not present

## 2022-10-16 DIAGNOSIS — K625 Hemorrhage of anus and rectum: Secondary | ICD-10-CM | POA: Diagnosis not present

## 2022-10-19 ENCOUNTER — Ambulatory Visit: Payer: BC Managed Care – PPO | Admitting: Behavioral Health

## 2022-10-20 ENCOUNTER — Ambulatory Visit (INDEPENDENT_AMBULATORY_CARE_PROVIDER_SITE_OTHER): Payer: BC Managed Care – PPO | Admitting: Behavioral Health

## 2022-10-20 DIAGNOSIS — F411 Generalized anxiety disorder: Secondary | ICD-10-CM

## 2022-10-20 DIAGNOSIS — F331 Major depressive disorder, recurrent, moderate: Secondary | ICD-10-CM
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
## 2022-10-20 NOTE — Progress Notes (Signed)
                Brittyn Salaz M Doyel Mulkern, LCMHC 

## 2022-10-21 ENCOUNTER — Encounter: Payer: Self-pay | Admitting: Behavioral Health

## 2022-11-10 ENCOUNTER — Encounter: Payer: Self-pay | Admitting: Behavioral Health

## 2022-11-10 ENCOUNTER — Ambulatory Visit (INDEPENDENT_AMBULATORY_CARE_PROVIDER_SITE_OTHER): Payer: BC Managed Care – PPO | Admitting: Behavioral Health

## 2022-11-10 DIAGNOSIS — F411 Generalized anxiety disorder: Secondary | ICD-10-CM | POA: Diagnosis not present

## 2022-11-10 NOTE — Progress Notes (Signed)
                Brae Gartman M Aubery Date, LCMHC 

## 2022-11-10 NOTE — Progress Notes (Signed)
[place] Counselor/Therapist Progress Note  Patient ID: April Paul, MRN: [id number],    Date: 11/10/2022  Time Spent: 57 minutes spent face-to-face with the patient.  Treatment Type: Individual Therapy  Reported Symptoms: Anxiety, depression  Mental Status Exam: Appearance:  Well Groomed     Behavior: Appropriate  Motor: Normal  Speech/Language:  Negative  Affect: Appropriate  Mood: normal  Thought process: normal  Thought content:   WNL  Sensory/Perceptual disturbances:   WNL  Orientation: oriented to person, place, time/date, situation, day of week, and month of year  Attention: Good  Concentration: Good  Memory: WNL  Fund of knowledge:  Good  Insight:   Good  Judgment:  Good  Impulse Control: Good   Risk Assessment: Danger to Self:  No Self-injurious Behavior: No Danger to Others: No Duty to Warn:no Physical Aggression / Violence:No  Access to Firearms a concern: No  Gang Involvement:No   Subjective: The patient reports that the past few weeks have been good.  We reviewed our initial session and for the most part she feels that we have some goals to aim for.  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  She says that she is slowly climbing out of that situational depression.  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  She has a very supportive husband and a great relationship with her children.  We talked about her relationship with her mother.  She talks to her almost daily and sees them weekly.  About once a month her mother will keep her children for them to go well but her mother has a perception that the patient does not reach out to her enough.  We talked about accepting the fact that she cannot change her mother's  perception in this situation.  We also looked a little bit more about her history and how that influences who she is as well as her husband's history and how that affects him and how they have made that work so well together. The patient and her family are going camping over the week of spring break and looking forward to that.  Interventions: Cognitive Behavioral Therapy and Dialectical Behavioral Therapy  Diagnosis: Generalized anxiety disorder, major depressive disorder, recurrent, moderate  Plan: I will meet with the patient every 2 to 3 weeks face-to-face. Treatment plan: We will use cognitive behavioral therapy principles as well as elements of dialectical behavior therapy.  Goals are to have less sadness as indicated by patient report and PHQ-9, have improved mood continually and to return to a healthier level of functioning.  We will use CBT to explore and replace unhealthy thoughts as well as to encourage shearing of feelings related to causes and symptoms of depression.  We will also teach and encouraged use of coping skills for management of depressive symptoms.  Plans to reduce anxiety including improving her ability to manage stress symptoms better and resolve core conflicts contributing to her anxiety as well as manage thoughts and worrisome thinking.  We will use coping skills as a part of CBT as well as distress tolerance and mindfulness skills from dialectical behavior therapy. Sabas Sous, [facility]

## 2022-11-24 ENCOUNTER — Ambulatory Visit: Payer: BC Managed Care – PPO | Admitting: Behavioral Health

## 2023-01-14 DIAGNOSIS — K2980 Duodenitis without bleeding: Secondary | ICD-10-CM | POA: Diagnosis not present

## 2023-01-14 DIAGNOSIS — R197 Diarrhea, unspecified: Secondary | ICD-10-CM | POA: Diagnosis not present

## 2023-01-14 DIAGNOSIS — D509 Iron deficiency anemia, unspecified: Secondary | ICD-10-CM | POA: Diagnosis not present

## 2023-01-14 DIAGNOSIS — K6389 Other specified diseases of intestine: Secondary | ICD-10-CM | POA: Diagnosis not present

## 2023-02-12 DIAGNOSIS — K9089 Other intestinal malabsorption: Secondary | ICD-10-CM | POA: Diagnosis not present

## 2023-02-12 DIAGNOSIS — K580 Irritable bowel syndrome with diarrhea: Secondary | ICD-10-CM | POA: Diagnosis not present

## 2023-03-17 ENCOUNTER — Telehealth: Payer: Self-pay | Admitting: Family Medicine

## 2023-03-17 NOTE — Telephone Encounter (Signed)
Patient requesting health examination certification be completed by Aug 15th. Will place paperwork in dr. Evelise Royalty box.

## 2023-03-19 NOTE — Telephone Encounter (Signed)
Form completed and placed in Panya's box

## 2023-03-19 NOTE — Telephone Encounter (Signed)
Completed form was faxed to [phone]. Confirmation received.

## 2023-06-28 DIAGNOSIS — M2011 Hallux valgus (acquired), right foot: Secondary | ICD-10-CM | POA: Diagnosis not present

## 2023-06-28 DIAGNOSIS — M205X1 Other deformities of toe(s) (acquired), right foot: Secondary | ICD-10-CM | POA: Diagnosis not present

## 2023-06-28 DIAGNOSIS — M7751 Other enthesopathy of right foot: Secondary | ICD-10-CM | POA: Diagnosis not present

## 2023-06-28 DIAGNOSIS — M79671 Pain in right foot: Secondary | ICD-10-CM | POA: Diagnosis not present

## 2023-06-29 DIAGNOSIS — M7751 Other enthesopathy of right foot: Secondary | ICD-10-CM | POA: Insufficient documentation

## 2023-08-19 DIAGNOSIS — M79671 Pain in right foot: Secondary | ICD-10-CM | POA: Diagnosis not present

## 2023-08-19 DIAGNOSIS — M1990 Unspecified osteoarthritis, unspecified site: Secondary | ICD-10-CM | POA: Diagnosis not present

## 2023-08-19 DIAGNOSIS — M2011 Hallux valgus (acquired), right foot: Secondary | ICD-10-CM | POA: Diagnosis not present

## 2023-08-19 DIAGNOSIS — M24574 Contracture, right foot: Secondary | ICD-10-CM | POA: Diagnosis not present

## 2023-08-19 DIAGNOSIS — M7751 Other enthesopathy of right foot: Secondary | ICD-10-CM | POA: Diagnosis not present

## 2023-08-19 DIAGNOSIS — M205X1 Other deformities of toe(s) (acquired), right foot: Secondary | ICD-10-CM | POA: Diagnosis not present

## 2023-09-07 DIAGNOSIS — Z1331 Encounter for screening for depression: Secondary | ICD-10-CM | POA: Diagnosis not present

## 2023-09-07 DIAGNOSIS — Z4789 Encounter for other orthopedic aftercare: Secondary | ICD-10-CM | POA: Diagnosis not present

## 2023-09-27 ENCOUNTER — Encounter: Payer: BC Managed Care – PPO | Admitting: Family Medicine

## 2023-09-29 ENCOUNTER — Ambulatory Visit (INDEPENDENT_AMBULATORY_CARE_PROVIDER_SITE_OTHER): Payer: BC Managed Care – PPO | Admitting: Family Medicine

## 2023-09-29 ENCOUNTER — Encounter: Payer: Self-pay | Admitting: Family Medicine

## 2023-09-29 VITALS — BP 105/74 | HR 67 | Wt 143.0 lb

## 2023-09-29 DIAGNOSIS — Z124 Encounter for screening for malignant neoplasm of cervix: Secondary | ICD-10-CM

## 2023-09-29 DIAGNOSIS — Z13220 Encounter for screening for lipoid disorders: Secondary | ICD-10-CM

## 2023-09-29 DIAGNOSIS — Z0000 Encounter for general adult medical examination without abnormal findings: Secondary | ICD-10-CM | POA: Diagnosis not present

## 2023-09-29 NOTE — Assessment & Plan Note (Addendum)
Well adult Orders Placed This Encounter  Procedures   CMP14+EGFR   CBC with Differential/Platelet   Lipid Panel With LDL/HDL Ratio   Ambulatory referral to Obstetrics / Gynecology    Referral Priority:   Routine    Referral Type:   Consultation    Referral Reason:   Specialty Services Required    Requested Specialty:   Obstetrics and Gynecology    Number of Visits Requested:   1  Screenings: Per lab orders Immunizations: Up-to-date Anticipatory guidance/risk factor reduction: Recommendations per AVS

## 2023-09-29 NOTE — Patient Instructions (Signed)

## 2023-09-29 NOTE — Progress Notes (Signed)
April Paul - 38 y.o. female MRN [id number]  Date of birth: 03-06-1986  Subjective Chief Complaint  Patient presents with   Annual Exam    HPI April Paul is a 38 y.o. female here today for annual exam.   She reports that she is doing well.  Recent surgery on foot, doing well.   Activity level is moderate.  She feels that her diet is pretty good.   She is a non-smoker. Occasional EtOH use.   Review of Systems  Constitutional:  Negative for chills, fever, malaise/fatigue and weight loss.  HENT:  Negative for congestion, ear pain and sore throat.   Eyes:  Negative for blurred vision, double vision and pain.  Respiratory:  Negative for cough and shortness of breath.   Cardiovascular:  Negative for chest pain and palpitations.  Gastrointestinal:  Negative for abdominal pain, blood in stool, constipation, heartburn and nausea.  Genitourinary:  Negative for dysuria and urgency.  Musculoskeletal:  Negative for joint pain and myalgias.  Neurological:  Negative for dizziness and headaches.  Endo/Heme/Allergies:  Does not bruise/bleed easily.  Psychiatric/Behavioral:  Negative for depression. The patient is not nervous/anxious and does not have insomnia.      No Known Allergies  Past Medical History:  Diagnosis Date   Anxiety    Hx of ovarian cyst    IBS (irritable bowel syndrome)     Past Surgical History:  Procedure Laterality Date   CESAREAN SECTION CLASSICAL     CHOLECYSTECTOMY     Ovaian cyst      Social History   Socioeconomic History   Marital status: Married    Spouse name: Not on file   Number of children: Not on file   Years of education: Not on file   Highest education level: Not on file  Occupational History   Not on file  Tobacco Use   Smoking status: Never   Smokeless tobacco: Never  Vaping Use   Vaping status: Never Used  Substance and Sexual Activity   Alcohol use: Yes    Alcohol/week: 3.0 - 5.0 standard drinks of alcohol    Types: 3 - 5  Standard drinks or equivalent per week    Comment: Occasionally   Drug use: Never   Sexual activity: Yes    Partners: Male    Birth control/protection: Other-see comments  Other Topics Concern   Not on file  Social History Narrative   Not on file   Social Drivers of Health   Financial Resource Strain: Not on file  Food Insecurity: No Food Insecurity (01/09/2021)   Received from [facility], [facility]   Hunger Vital Sign    Worried About Running Out of Food in the Last Year: Never true    Ran Out of Food in the Last Year: Never true  Transportation Needs: Not on file  Physical Activity: Not on file  Stress: Not on file  Social Connections: Unknown (12/22/2021)   Received from [facility], [facility]   Social Network    Social Network: Not on file    Family History  Problem Relation Age of Onset   Rheum arthritis Mother    Healthy Father    Heart attack Maternal Grandfather    Heart attack Paternal Grandmother     Health Maintenance  Topic Date Due   HIV Screening  Never done   Hepatitis C Screening  Never done   Cervical Cancer Screening (HPV/Pap Cotest)  Never done   INFLUENZA VACCINE  11/15/2023 (Originally  03/18/2023)   COVID-19 Vaccine (1 - 2024-25 season) 10/14/2024 (Originally 04/18/2023)   DTaP/Tdap/Td (2 - Td or Tdap) 05/28/2026   HPV VACCINES  Aged Out     ----------------------------------------------------------------------------------------------------------------------------------------------------------------------------------------------------------------- Physical Exam BP 105/74 (BP Location: Right Arm, Patient Position: Sitting, Cuff Size: Normal)   Pulse 67   Wt 143 lb (64.9 kg)   SpO2 99%   BMI 27.93 kg/m   Physical Exam Constitutional:      General: She is not in acute distress. HENT:     Head: Normocephalic and atraumatic.     Right Ear: Tympanic membrane and ear canal normal.     Left Ear: Tympanic membrane and ear canal normal.      Nose: Nose normal.  Eyes:     General: No scleral icterus.    Conjunctiva/sclera: Conjunctivae normal.  Neck:     Thyroid: No thyromegaly.  Cardiovascular:     Rate and Rhythm: Normal rate and regular rhythm.     Heart sounds: Normal heart sounds.  Pulmonary:     Effort: Pulmonary effort is normal.     Breath sounds: Normal breath sounds.  Abdominal:     General: Bowel sounds are normal. There is no distension.     Palpations: Abdomen is soft.     Tenderness: There is no abdominal tenderness. There is no guarding.  Musculoskeletal:        General: Normal range of motion.     Cervical back: Normal range of motion and neck supple.  Lymphadenopathy:     Cervical: No cervical adenopathy.  Skin:    General: Skin is warm and dry.     Findings: No rash.  Neurological:     General: No focal deficit present.     Mental Status: She is alert and oriented to person, place, and time.     Cranial Nerves: No cranial nerve deficit.     Coordination: Coordination normal.  Psychiatric:        Mood and Affect: Mood normal.        Behavior: Behavior normal.     ------------------------------------------------------------------------------------------------------------------------------------------------------------------------------------------------------------------- Assessment and Plan  Well adult exam Well adult Orders Placed This Encounter  Procedures   CMP14+EGFR   CBC with Differential/Platelet   Lipid Panel With LDL/HDL Ratio   Ambulatory referral to Obstetrics / Gynecology    Referral Priority:   Routine    Referral Type:   Consultation    Referral Reason:   Specialty Services Required    Requested Specialty:   Obstetrics and Gynecology    Number of Visits Requested:   1  Screenings: Per lab orders Immunizations: Up-to-date Anticipatory guidance/risk factor reduction: Recommendations per AVS   No orders of the defined types were placed in this encounter.   No  follow-ups on file.    This visit occurred during the SARS-CoV-2 public health emergency.  Safety protocols were in place, including screening questions prior to the visit, additional usage of staff PPE, and extensive cleaning of exam room while observing appropriate contact time as indicated for disinfecting solutions.

## 2023-09-30 LAB — CBC WITH DIFFERENTIAL/PLATELET
Basophils Absolute: 0 10*3/uL (ref 0.0–0.2)
Basos: 1 %
EOS (ABSOLUTE): 0.1 10*3/uL (ref 0.0–0.4)
Eos: 2 %
Hematocrit: 36.9 % (ref 34.0–46.6)
Hemoglobin: 12.3 g/dL (ref 11.1–15.9)
Immature Grans (Abs): 0 10*3/uL (ref 0.0–0.1)
Immature Granulocytes: 0 %
Lymphocytes Absolute: 1.3 10*3/uL (ref 0.7–3.1)
Lymphs: 25 %
MCH: 31.3 pg (ref 26.6–33.0)
MCHC: 33.3 g/dL (ref 31.5–35.7)
MCV: 94 fL (ref 79–97)
Monocytes Absolute: 0.3 10*3/uL (ref 0.1–0.9)
Monocytes: 5 %
Neutrophils Absolute: 3.6 10*3/uL (ref 1.4–7.0)
Neutrophils: 67 %
Platelets: 219 10*3/uL (ref 150–450)
RBC: 3.93 x10E6/uL (ref 3.77–5.28)
RDW: 12.1 % (ref 11.7–15.4)
WBC: 5.3 10*3/uL (ref 3.4–10.8)

## 2023-09-30 LAB — CMP14+EGFR
ALT: 16 [IU]/L (ref 0–32)
AST: 17 [IU]/L (ref 0–40)
Albumin: 4.1 g/dL (ref 3.9–4.9)
Alkaline Phosphatase: 24 [IU]/L — ABNORMAL LOW (ref 44–121)
BUN/Creatinine Ratio: 24 — ABNORMAL HIGH (ref 9–23)
BUN: 19 mg/dL (ref 6–20)
Bilirubin Total: 0.2 mg/dL (ref 0.0–1.2)
CO2: 23 mmol/L (ref 20–29)
Calcium: 8.9 mg/dL (ref 8.7–10.2)
Chloride: 105 mmol/L (ref 96–106)
Creatinine, Ser: 0.79 mg/dL (ref 0.57–1.00)
Globulin, Total: 2.1 g/dL (ref 1.5–4.5)
Glucose: 91 mg/dL (ref 70–99)
Potassium: 4.1 mmol/L (ref 3.5–5.2)
Sodium: 140 mmol/L (ref 134–144)
Total Protein: 6.2 g/dL (ref 6.0–8.5)
eGFR: 99 mL/min/{1.73_m2} (ref >59)

## 2023-09-30 LAB — LIPID PANEL WITH LDL/HDL RATIO
Cholesterol, Total: 162 mg/dL (ref 100–199)
HDL: 83 mg/dL (ref >39)
LDL Chol Calc (NIH): 63 mg/dL (ref 0–99)
LDL/HDL Ratio: 0.8 {ratio} (ref 0.0–3.2)
Triglycerides: 85 mg/dL (ref 0–149)
VLDL Cholesterol Cal: 16 mg/dL (ref 5–40)

## 2023-10-01 ENCOUNTER — Encounter: Payer: Self-pay | Admitting: Family Medicine

## 2023-10-11 DIAGNOSIS — Z4789 Encounter for other orthopedic aftercare: Secondary | ICD-10-CM | POA: Diagnosis not present

## 2023-10-26 ENCOUNTER — Other Ambulatory Visit (HOSPITAL_COMMUNITY)
Admission: RE | Admit: 2023-10-26 | Discharge: 2023-10-26 | Disposition: A | Discharge status: Home / Self Care | Source: Ambulatory Visit | Attending: Obstetrics and Gynecology | Admitting: Obstetrics and Gynecology

## 2023-10-26 ENCOUNTER — Ambulatory Visit (INDEPENDENT_AMBULATORY_CARE_PROVIDER_SITE_OTHER): Payer: BC Managed Care – PPO | Admitting: Obstetrics and Gynecology

## 2023-10-26 ENCOUNTER — Encounter: Payer: Self-pay | Admitting: Obstetrics and Gynecology

## 2023-10-26 VITALS — BP 108/68 | HR 71 | Ht 64.0 in | Wt 143.0 lb

## 2023-10-26 DIAGNOSIS — Z01419 Encounter for gynecological examination (general) (routine) without abnormal findings: Secondary | ICD-10-CM

## 2023-10-26 NOTE — Progress Notes (Signed)
 GYNECOLOGY ANNUAL PREVENTATIVE CARE ENCOUNTER NOTE  History:     Ellissa Ayo is a 38 y.o. [id number] female here for a routine annual gynecologic exam.  Current complaints: chronic pelvic pain, ovarian cysts and heavy menstrual cycles.   Denies abnormal vaginal bleeding, discharge, pelvic pain, problems with intercourse or other gynecologic concerns.   Hx of ovarian cysts and heavy menstrual. This has been going on her whole life. She would like to discuss permanent options for treatment. Has tried [facility] in the past without good results, does not like the idea of using hormones.   Gynecologic History Patient's last menstrual period was 10/18/2023. Contraception: none Last Pap: 2020. Result was normal with negative HPV Last Mammogram: NA  Obstetric History OB History  Gravida Para Term Preterm AB Living  2 2 2   2   SAB IAB Ectopic Multiple Live Births          # Outcome Date GA Lbr Len/2nd Weight Sex Type Anes PTL Lv  2 Term           1 Term             Past Medical History:  Diagnosis Date   Anxiety    Hx of ovarian cyst    IBS (irritable bowel syndrome)     Past Surgical History:  Procedure Laterality Date   CESAREAN SECTION CLASSICAL     CHOLECYSTECTOMY     Ovaian cyst      No current outpatient medications on file prior to visit.   No current facility-administered medications on file prior to visit.    No Known Allergies  Social History:  reports that she has never smoked. She has never used smokeless tobacco. She reports current alcohol use of about 3.0 - 5.0 standard drinks of alcohol per week. She reports that she does not use drugs.  Family History  Problem Relation Age of Onset   Rheum arthritis Mother    Healthy Father    Heart attack Maternal Grandfather    Heart attack Paternal Grandmother     The following portions of the patient's history were reviewed and updated as appropriate: allergies, current medications, past family history, past medical  history, past social history, past surgical history and problem list.  Review of Systems Pertinent items noted in HPI and remainder of comprehensive ROS otherwise negative.  Physical Exam:  BP 108/68   Pulse 71   Ht 5\' 4"  (1.626 m)   Wt 143 lb (64.9 kg)   LMP 10/18/2023   BMI 24.55 kg/m  CONSTITUTIONAL: Well-developed, well-nourished female in no acute distress.  HENT:  Normocephalic, atraumatic, External right and left ear normal.  EYES: Conjunctivae and EOM are normal. Pupils are equal, round, and reactive to light. No scleral icterus.  NECK: Normal range of motion, supple, no masses.  Normal thyroid.  SKIN: Skin is warm and dry. No rash noted. Not diaphoretic. No erythema. No pallor. MUSCULOSKELETAL: Normal range of motion. No tenderness.  No cyanosis, clubbing, or edema. NEUROLOGIC: Alert and oriented to person, place, and time. Normal reflexes, muscle tone coordination.  PSYCHIATRIC: Normal mood and affect. Normal behavior. Normal judgment and thought content. CARDIOVASCULAR: Normal heart rate noted, regular rhythm RESPIRATORY: Clear to auscultation bilaterally. Effort and breath sounds normal, no problems with respiration noted. BREASTS: Symmetric in size. No masses, tenderness, skin changes, nipple drainage, or lymphadenopathy bilaterally. Performed in the presence of a chaperone. ABDOMEN: Soft, no distention noted.   PELVIC: Normal appearing external genitalia and urethral  meatus; normal appearing vaginal mucosa and cervix.  No abnormal vaginal discharge noted.  Pap smear obtained.  Normal uterine size, Right adnexa fullness noted.  no other palpable masses, no uterine or adnexal tenderness.  Performed in the presence of a chaperone.   Assessment and Plan:    1. Well woman exam with routine gynecological exam (Primary)  - Cytology - PAP( [facility])  - Schedule an appointment with OB to discuss hysterectomy.   Will follow up results of pap smear and manage  accordingly. Normal breast examination today, she was advised to perform periodic self breast examinations.  Routine preventative health maintenance measures emphasized. Please refer to After Visit Summary for other counseling recommendations.    Anwar Crill, Harolyn Rutherford, NP [facility] [employer], [facility]

## 2023-10-28 LAB — CYTOLOGY - PAP
Comment: NEGATIVE
Diagnosis: NEGATIVE
High risk HPV: NEGATIVE

## 2023-11-06 ENCOUNTER — Encounter: Payer: Self-pay | Admitting: Obstetrics and Gynecology

## 2023-11-06 NOTE — Progress Notes (Unsigned)
 GYNECOLOGY OFFICE VISIT NOTE  History:   April Paul is a 38 y.o. [phone] here today for discussion regarding options for heavy periods and ovarian cysts.  She does not have a known current cyst. She has a history of classical c-section? ***     Past Medical History:  Diagnosis Date   Anxiety    Hx of ovarian cyst    IBS (irritable bowel syndrome)     Past Surgical History:  Procedure Laterality Date   CESAREAN SECTION CLASSICAL     CHOLECYSTECTOMY     Ovaian cyst      The following portions of the patient's history were reviewed and updated as appropriate: allergies, current medications, past family history, past medical history, past social history, past surgical history and problem list.   Health Maintenance:   Normal pap and negative HRHPV:   Diagnosis  Date Value Ref Range Status  10/26/2023   Final   - Negative for intraepithelial lesion or malignancy (NILM)   Review of Systems:  Pertinent items noted in HPI and remainder of comprehensive ROS otherwise negative.  Physical Exam:  LMP 10/18/2023  CONSTITUTIONAL: Well-developed, well-nourished female in no acute distress.  HEENT:  Normocephalic, atraumatic. External right and left ear normal. No scleral icterus.  NECK: Normal range of motion, supple, no masses noted on observation SKIN: No rash noted. Not diaphoretic. No erythema. No pallor. MUSCULOSKELETAL: Normal range of motion. No edema noted. NEUROLOGIC: Alert and oriented to person, place, and time. Normal muscle tone coordination. No cranial nerve deficit noted. PSYCHIATRIC: Normal mood and affect. Normal behavior. Normal judgment and thought content.  PELVIC: {Blank single:19197::"Deferred","Normal appearing external genitalia; normal urethral meatus; normal appearing vaginal mucosa and cervix.  No abnormal discharge noted.  Normal uterine size, no other palpable masses, no uterine or adnexal tenderness. Performed in the presence of a chaperone"}      GYNECOLOGY OFFICE PROCEDURE NOTE   April Paul is a 38 y.o. [id number] here for endometrial biopsy for AUB.   ENDOMETRIAL BIOPSY     The indications for endometrial biopsy were reviewed.   Risks of the biopsy including cramping, bleeding, infection, uterine perforation, inadequate specimen and need for additional procedures were discussed. Offered alternative of hysteroscopy, dilation and curettage in OR. The patient states she understands the R/B/I/A and agrees to undergo procedure today. Urine pregnancy test was {Blank single:19197::"Negative","Not indicated"}. Consent was signed. Time out was performed.    Patient was positioned in dorsal lithotomy position. A vaginal speculum was placed.  The cervix was visualized and was prepped with Betadine.  A single-toothed tenaculum was placed on the anterior lip of the cervix to stabilize it. The 3 mm pipelle was easily introduced into the endometrial cavity without difficulty to a depth of *** cm, and a {Blank single:19197::"Scant","Moderate"} amount of tissue was obtained after two passes and sent to pathology. The instruments were removed from the patient's vagina. Minimal bleeding from the cervix was noted. The patient tolerated the procedure well.   Assessment and Plan:   1. Abnormal uterine bleeding (AUB) (Primary) Check pelvic US EMB today Recent pap wnl  2. Cyst of ovary, unspecified laterality Reviewed no surgical option besides removal of     Diagnoses and all orders for this visit:  Abnormal uterine bleeding (AUB)  Cyst of ovary, unspecified laterality     No orders of the defined types were placed in this encounter.    Routine preventative health maintenance measures emphasized. Please refer to After Visit Summary for other  counseling recommendations.   No follow-ups on file.  Milas Hock, MD, FACOG Obstetrician & Gynecologist, [facility] for [facility], [facility]

## 2023-11-10 ENCOUNTER — Encounter: Payer: Self-pay | Admitting: Obstetrics and Gynecology

## 2023-11-10 ENCOUNTER — Other Ambulatory Visit (HOSPITAL_COMMUNITY)
Admission: RE | Admit: 2023-11-10 | Discharge: 2023-11-10 | Disposition: A | Discharge status: Home / Self Care | Source: Ambulatory Visit | Attending: Obstetrics and Gynecology | Admitting: Obstetrics and Gynecology

## 2023-11-10 ENCOUNTER — Ambulatory Visit: Admitting: Obstetrics and Gynecology

## 2023-11-10 VITALS — BP 104/73 | HR 80 | Ht 64.0 in | Wt 146.0 lb

## 2023-11-10 DIAGNOSIS — N939 Abnormal uterine and vaginal bleeding, unspecified: Secondary | ICD-10-CM | POA: Diagnosis not present

## 2023-11-10 DIAGNOSIS — Z3202 Encounter for pregnancy test, result negative: Secondary | ICD-10-CM

## 2023-11-10 DIAGNOSIS — N83209 Unspecified ovarian cyst, unspecified side: Secondary | ICD-10-CM | POA: Diagnosis not present

## 2023-11-10 LAB — POCT URINE PREGNANCY: Preg Test, Ur: NEGATIVE

## 2023-11-11 DIAGNOSIS — Z4789 Encounter for other orthopedic aftercare: Secondary | ICD-10-CM | POA: Diagnosis not present

## 2023-11-11 MED ORDER — TRANEXAMIC ACID 650 MG PO TABS: 1300.0000 mg | ORAL_TABLET | Freq: Three times a day (TID) | ORAL | 6 refills | Status: AC

## 2023-11-11 NOTE — Addendum Note (Signed)
 Addended by: Milas Hock A on: 11/11/2023 08:27 AM   Modules accepted: Orders

## 2023-11-12 LAB — SURGICAL PATHOLOGY

## 2023-11-15 ENCOUNTER — Encounter: Payer: Self-pay | Admitting: Obstetrics and Gynecology

## 2023-11-15 ENCOUNTER — Ambulatory Visit

## 2023-11-15 DIAGNOSIS — N83209 Unspecified ovarian cyst, unspecified side: Secondary | ICD-10-CM

## 2023-11-15 DIAGNOSIS — N939 Abnormal uterine and vaginal bleeding, unspecified: Secondary | ICD-10-CM

## 2023-11-15 DIAGNOSIS — N920 Excessive and frequent menstruation with regular cycle: Secondary | ICD-10-CM | POA: Diagnosis not present

## 2023-11-16 ENCOUNTER — Encounter: Payer: Self-pay | Admitting: Obstetrics and Gynecology

## 2024-05-16 ENCOUNTER — Encounter: Payer: Self-pay | Admitting: Family Medicine

## 2024-05-25 NOTE — Telephone Encounter (Signed)
 Pt contacted via MyChart for pick-up. Documents in [employer]

## 2024-10-02 ENCOUNTER — Encounter: Payer: BC Managed Care – PPO | Admitting: Family Medicine
